# Patient Record
Sex: Male | Born: 2000 | Race: White | Hispanic: No | Marital: Single | Smoking: Never smoker
Health system: Southern US, Community
[De-identification: ages and names within clinical notes are randomized; demographics above are authoritative.]

---
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
## 2022-09-07 ENCOUNTER — Other Ambulatory Visit: Payer: Self-pay

## 2022-09-07 ENCOUNTER — Ambulatory Visit (INDEPENDENT_AMBULATORY_CARE_PROVIDER_SITE_OTHER): Payer: BC Managed Care – PPO

## 2022-09-07 ENCOUNTER — Ambulatory Visit
Admission: EM | Admit: 2022-09-07 | Discharge: 2022-09-07 | Disposition: A | Discharge status: Home / Self Care | Payer: BC Managed Care – PPO | Attending: Nurse Practitioner | Admitting: Nurse Practitioner

## 2022-09-07 DIAGNOSIS — R0602 Shortness of breath: Secondary | ICD-10-CM

## 2022-09-07 DIAGNOSIS — R079 Chest pain, unspecified: Secondary | ICD-10-CM | POA: Diagnosis not present

## 2022-09-07 DIAGNOSIS — R0789 Other chest pain: Secondary | ICD-10-CM | POA: Diagnosis not present

## 2022-09-07 NOTE — ED Provider Notes (Signed)
[facility]    CSN: [id number] Arrival date & time: 09/07/22  1407      History   Chief Complaint Chief Complaint  Patient presents with   Fatigue    I was unsure what to check. My son is having difficulty breathing and his blood pressure is low, but his pulse is high without any physical activity. - Entered by patient    HPI Thomas Downs is a 21 y.o. male.   The history is provided by the patient and a parent.   Patient brought in by his mother for sudden onset of shortness of breath, chest pain, numbness and tingling in hands and feet, chest heaviness.  Patient's mother states symptoms started this morning.  She states patient has not had any fever, chills, wheezing, abdominal pain, vomiting, or diarrhea.  Patient states that he has been nauseated.  Patient's mother states that patient ate a normal dinner last evening.  She states that symptoms started when he was playing his video game earlier today.  Patient and mother deny previous history of anxiety, depression, or panic attacks.  History reviewed. No pertinent past medical history.  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 No pertinent surgical history.     Home Medications    Prior to Admission medications   Medication Sig Start Date End Date Taking? Authorizing Provider  amphetamine-dextroamphetamine (ADDERALL XR) 10 MG 24 hr capsule Take 10 mg by mouth daily.    [provider]  cyproheptadine (PERIACTIN) 4 MG tablet Take one tablet twice daily at breakfast and dinner. 09/15/14 09/16/15  David Stall, MD  lisdexamfetamine (VYVANSE) 20 MG capsule Take 20 mg by mouth daily.    [provider]    Family History History reviewed. No pertinent family history.  Social History Social History    Tobacco Use   Smoking status: Never  Substance Use Topics   Alcohol use: Never   Drug use: Never     Allergies   Patient has no known allergies.   Review of Systems Review of Systems Per HPI  Physical Exam Triage Vital Signs ED Triage Vitals  Enc Vitals Group     BP 09/07/22 1422 120/76     Pulse Rate 09/07/22 1422 97     Resp 09/07/22 1422 18     Temp 09/07/22 1422 98 F (36.7 C)     Temp Source 09/07/22 1422 Oral     SpO2 09/07/22 1422 98 %     Weight --      Height --      Head Circumference --      Peak Flow --      Pain Score 09/07/22 1426 6     Pain Loc --      Pain Edu? --      Excl. in GC? --    No data found.  Updated Vital Signs BP 120/76 (BP Location: Right Arm)   Pulse 97   Temp 98 F (36.7 C) (Oral)   Resp 18   SpO2 98%   Visual Acuity Right Eye Distance:   Left Eye Distance:   Bilateral Distance:    Right Eye Near:   Left Eye Near:    Bilateral Near:     Physical Exam Vitals and nursing note reviewed.  Constitutional:  General: He is not in acute distress.    Appearance: Normal appearance.  HENT:     Head: Normocephalic.     Right Ear: Tympanic membrane, ear canal and external ear normal.     Left Ear: Tympanic membrane, ear canal and external ear normal.     Mouth/Throat:     Mouth: Mucous membranes are moist.  Eyes:     Extraocular Movements: Extraocular movements intact.     Conjunctiva/sclera: Conjunctivae normal.     Pupils: Pupils are equal, round, and reactive to light.  Cardiovascular:     Rate and Rhythm: Normal rate and regular rhythm.     Pulses: Normal pulses.     Heart sounds: Normal heart sounds.  Pulmonary:     Effort: Pulmonary effort is normal.     Breath sounds: Normal breath sounds.  Abdominal:     General: Bowel sounds are normal.     Palpations: Abdomen is soft.  Musculoskeletal:     Cervical back: Normal range of motion.  Lymphadenopathy:     Cervical: No cervical adenopathy.  Skin:     General: Skin is warm and dry.  Neurological:     General: No focal deficit present.     Mental Status: He is alert and oriented to person, place, and time.  Psychiatric:        Mood and Affect: Mood normal.        Behavior: Behavior normal.      UC Treatments / Results  Labs (all labs ordered are listed, but only abnormal results are displayed) Labs Reviewed - No data to display  EKG: Normal sinus rhythm, no ectopy, no STEMI   Radiology DG Chest 2 View  Result Date: 09/07/2022 CLINICAL DATA:  Chest pain and shortness of breath for a day. EXAM: CHEST - 2 VIEW COMPARISON:  None Available. FINDINGS: The heart size and mediastinal contours are within normal limits. Both lungs are clear. The visualized skeletal structures are unremarkable. IMPRESSION: No active cardiopulmonary disease. Electronically Signed   By: Gerome Sam III M.D.   On: 09/07/2022 15:08    Procedures Procedures (including critical care time)  Medications Ordered in UC Medications - No data to display  Initial Impression / Assessment and Plan / UC Course  I have reviewed the triage vital signs and the nursing notes.  Pertinent labs & imaging results that were available during my care of the patient were reviewed by me and considered in my medical decision making (see chart for details).  Patient presents for sudden onset of chest pain, shortness of breath, difficulty breathing.  His vital signs are stable, he is well-appearing, and currently is in no acute distress.  EKG showed normal sinus rhythm without ectopy, chest x-ray did not show any active cardiopulmonary disease.  Based on the patient's onset of symptoms and current presentation, symptoms are more consistent with anxiety or panic attack.  Advised patient's mother to follow-up with the patient's primary care physician for reevaluation of his symptoms.  Supportive care recommendations were provided to the patient.  Patient verbalizes understanding.  All  questions were answered.  Patient is stable for discharge. Final Clinical Impressions(s) / UC Diagnoses   Final diagnoses:  Shortness of breath  Other chest pain     Discharge Instructions      The EKG is normal. The chest xray is also negative.  Increase fluids and allow for plenty of rest. Symptoms appear to be related to anxiety and panic symptoms. May take  Tylenol or Ibuprofen as needed for pain, fever or discomfort. As discussed, please follow up with a primary care physician for further evaluation.       ED Prescriptions   None    PDMP not reviewed this encounter.   Tish Men, NP 09/07/22 1552

## 2022-09-07 NOTE — ED Triage Notes (Signed)
Pt reports breathing trouble, chest pain, fast heart beat, and hands and feet numb. Feeels as if his hands are heavy. Feet are numb on and off. Just started today out of nowhere. Was shaking earlier    Chest pain on and off at an 6  When mom took blood pressure 96/67 Pulse 121 dad took hr later 89/47 Pulse 91   When he stands from bed he gets dizzy and falls back to bed for a week now

## 2022-09-07 NOTE — Discharge Instructions (Addendum)
The EKG is normal. The chest xray is also negative.  Increase fluids and allow for plenty of rest. Symptoms appear to be related to anxiety and panic symptoms. May take Tylenol or Ibuprofen as needed for pain, fever or discomfort. As discussed, please follow up with a primary care physician for further evaluation.

## 2022-09-23 ENCOUNTER — Encounter: Payer: Self-pay | Admitting: Emergency Medicine

## 2022-09-23 ENCOUNTER — Ambulatory Visit
Admission: EM | Admit: 2022-09-23 | Discharge: 2022-09-23 | Disposition: A | Discharge status: Home / Self Care | Payer: BC Managed Care – PPO | Attending: Family Medicine | Admitting: Family Medicine

## 2022-09-23 ENCOUNTER — Ambulatory Visit: Admit: 2022-09-23 | Discharge status: Absent without leave | Payer: BC Managed Care – PPO

## 2022-09-23 ENCOUNTER — Other Ambulatory Visit: Payer: Self-pay

## 2022-09-23 DIAGNOSIS — K219 Gastro-esophageal reflux disease without esophagitis: Secondary | ICD-10-CM | POA: Diagnosis not present

## 2022-09-23 DIAGNOSIS — R1013 Epigastric pain: Secondary | ICD-10-CM | POA: Diagnosis not present

## 2022-09-23 DIAGNOSIS — R142 Eructation: Secondary | ICD-10-CM | POA: Diagnosis not present

## 2022-09-23 MED ORDER — SUCRALFATE 1 G PO TABS: 1.0000 g | ORAL_TABLET | Freq: Three times a day (TID) | ORAL | 0 refills | Status: AC | PRN

## 2022-09-23 MED ORDER — ALUM & MAG HYDROXIDE-SIMETH 200-200-20 MG/5ML PO SUSP
30.0000 mL | Freq: Once | ORAL | Status: AC
  Administered 2022-09-23: 30 mL via ORAL

## 2022-09-23 MED ORDER — PANTOPRAZOLE SODIUM 40 MG PO TBEC: 40.0000 mg | DELAYED_RELEASE_TABLET | Freq: Every day | ORAL | 0 refills | Status: AC

## 2022-09-23 NOTE — ED Triage Notes (Addendum)
Pt reports was seen for same a few weeks ago. Pt reports intermittent abdominal pain, nausea for last several weeks. Pt reports increased flatus/belching and pain as well. Have tried otc pepto,probiotic, zantac denies any change in symptoms. Last BM was black and x2 days ago.

## 2022-09-24 LAB — LIPASE: Lipase: 42 U/L (ref 13–78)

## 2022-09-24 LAB — CBC WITH DIFFERENTIAL/PLATELET
Basophils Absolute: 0.1 10*3/uL (ref 0.0–0.2)
Basos: 1 %
EOS (ABSOLUTE): 0.1 10*3/uL (ref 0.0–0.4)
Eos: 1 %
Hematocrit: 45.6 % (ref 37.5–51.0)
Hemoglobin: 15.5 g/dL (ref 13.0–17.7)
Immature Grans (Abs): 0 10*3/uL (ref 0.0–0.1)
Immature Granulocytes: 0 %
Lymphocytes Absolute: 2.5 10*3/uL (ref 0.7–3.1)
Lymphs: 46 %
MCH: 26.7 pg (ref 26.6–33.0)
MCHC: 34 g/dL (ref 31.5–35.7)
MCV: 79 fL (ref 79–97)
Monocytes Absolute: 0.6 10*3/uL (ref 0.1–0.9)
Monocytes: 11 %
Neutrophils Absolute: 2.2 10*3/uL (ref 1.4–7.0)
Neutrophils: 41 %
Platelets: 291 10*3/uL (ref 150–450)
RBC: 5.81 x10E6/uL — ABNORMAL HIGH (ref 4.14–5.80)
RDW: 13.6 % (ref 11.6–15.4)
WBC: 5.4 10*3/uL (ref 3.4–10.8)

## 2022-09-24 LAB — COMPREHENSIVE METABOLIC PANEL
ALT: 11 IU/L (ref 0–44)
AST: 11 IU/L (ref 0–40)
Albumin/Globulin Ratio: 2 (ref 1.2–2.2)
Albumin: 5 g/dL (ref 4.3–5.2)
Alkaline Phosphatase: 68 IU/L (ref 51–125)
BUN/Creatinine Ratio: 10 (ref 9–20)
BUN: 9 mg/dL (ref 6–20)
Bilirubin Total: 0.5 mg/dL (ref 0.0–1.2)
CO2: 22 mmol/L (ref 20–29)
Calcium: 9.6 mg/dL (ref 8.7–10.2)
Chloride: 103 mmol/L (ref 96–106)
Creatinine, Ser: 0.89 mg/dL (ref 0.76–1.27)
Globulin, Total: 2.5 g/dL (ref 1.5–4.5)
Glucose: 83 mg/dL (ref 70–99)
Potassium: 4.5 mmol/L (ref 3.5–5.2)
Sodium: 138 mmol/L (ref 134–144)
Total Protein: 7.5 g/dL (ref 6.0–8.5)
eGFR: 127 mL/min/{1.73_m2} (ref >59)

## 2022-09-27 NOTE — ED Provider Notes (Addendum)
[facility]    CSN: [id number] Arrival date & time: 09/23/22  1749      History   Chief Complaint Chief Complaint  Patient presents with   Abdominal Pain    HPI Thomas Downs is a 21 y.o. male.   Patient presenting today with several week history of intermittent upper abdominal pain, worse in the epigastric area, nausea, increased belching and flatus.  Denies significant vomiting, fever, chills, hematemesis, diarrhea but states his last BM was dark in color.  Has been trying over-the-counter Pepto-Bismol, probiotics, Zantac, bland foods with no improvement in symptoms.  Denies unintentional weight loss, history of known GI issues.    History reviewed. No pertinent past medical history.  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 No pertinent surgical history.     Home Medications    Prior to Admission medications   Medication Sig Start Date End Date Taking? Authorizing Provider  pantoprazole (PROTONIX) 40 MG tablet Take 1 tablet (40 mg total) by mouth daily. 09/23/22  Yes Volney American, PA-C  sucralfate (CARAFATE) 1 g tablet Take 1 tablet (1 g total) by mouth 3 (three) times daily as needed. May dissolve 1 tablet into a glass of water and drink 3 times daily as needed prior to meals 09/23/22  Yes Volney American, PA-C  amphetamine-dextroamphetamine (ADDERALL XR) 10 MG 24 hr capsule Take 10 mg by mouth daily.    [provider]  cyproheptadine (PERIACTIN) 4 MG tablet Take one tablet twice daily at breakfast and dinner. 09/15/14 09/16/15  Sherrlyn Hock, MD  lisdexamfetamine (VYVANSE) 20 MG capsule Take 20 mg by mouth daily.    [provider]    Family History History reviewed. No pertinent family history.  Social History Social History    Tobacco Use   Smoking status: Never  Substance Use Topics   Alcohol use: Never   Drug use: Never     Allergies   Patient has no known allergies.   Review of Systems Review of Systems PER HPI  Physical Exam Triage Vital Signs ED Triage Vitals  Enc Vitals Group     BP 09/23/22 1901 112/74     Pulse Rate 09/23/22 1901 80     Resp 09/23/22 1901 16     Temp 09/23/22 1901 97.9 F (36.6 C)     Temp Source 09/23/22 1901 Oral     SpO2 09/23/22 1901 97 %     Weight --      Height --      Head Circumference --      Peak Flow --      Pain Score 09/23/22 1928 9     Pain Loc --      Pain Edu? --      Excl. in [place]? --    No data found.  Updated Vital Signs BP 112/74 (BP Location: Right Arm)   Pulse 80   Temp 97.9 F (36.6 C) (Oral)   Resp 16   SpO2 97%   Visual Acuity Right Eye Distance:   Left Eye Distance:   Bilateral Distance:    Right Eye Near:   Left Eye Near:    Bilateral Near:     Physical Exam Vitals and nursing note reviewed.  Constitutional:      Appearance: Normal appearance.  HENT:  Head: Atraumatic.     Nose: Nose normal.     Mouth/Throat:     Mouth: Mucous membranes are moist.  Eyes:     Extraocular Movements: Extraocular movements intact.     Conjunctiva/sclera: Conjunctivae normal.  Cardiovascular:     Rate and Rhythm: Normal rate and regular rhythm.     Heart sounds: Normal heart sounds.  Pulmonary:     Effort: Pulmonary effort is normal.     Breath sounds: Normal breath sounds. No wheezing or rales.  Abdominal:     General: Bowel sounds are normal. There is no distension.     Palpations: Abdomen is soft.     Tenderness: There is abdominal tenderness. There is no right CVA tenderness, left CVA tenderness or guarding.  Musculoskeletal:        General: Normal range of motion.     Cervical back: Normal range of motion and neck supple.  Skin:    General: Skin is warm and dry.  Neurological:     General: No focal deficit present.      Mental Status: He is oriented to person, place, and time.  Psychiatric:        Mood and Affect: Mood normal.        Thought Content: Thought content normal.        Judgment: Judgment normal.    UC Treatments / Results  Labs (all labs ordered are listed, but only abnormal results are displayed) Labs Reviewed  CBC WITH DIFFERENTIAL/PLATELET - Abnormal; Notable for the following components:      Result Value   RBC 5.81 (*)    All other components within normal limits   Narrative:    Performed at:  [address] [place], [place]  [id number] Lab Director: Rush Farmer MD, Phone:  [phone]  COMPREHENSIVE METABOLIC PANEL   Narrative:    Performed at:  [address] [place] [address], [place], [place]  [id number] Lab Director: Rush Farmer MD, Phone:  [phone]  LIPASE   Narrative:    Performed at:  [facility] [address] [place], [place]  [id number] Lab Director: Rush Farmer MD, Phone:  [phone]   EKG  Radiology No results found.  Procedures Procedures (including critical care time)  Medications Ordered in UC Medications  alum & mag hydroxide-simeth (MAALOX/MYLANTA) 200-200-20 MG/5ML suspension 30 mL (30 mLs Oral Given 09/23/22 1951)    Initial Impression / Assessment and Plan / UC Course  I have reviewed the triage vital signs and the nursing notes.  Pertinent labs & imaging results that were available during my care of the patient were reviewed by me and considered in my medical decision making (see chart for details).     Vital signs and exam reassuring and suspicious for a gastritis type issue from uncontrolled GERD.  Suspect his dark stool was related to Pepto-Bismol use.  Treat with GI cocktail in clinic, several weeks on Protonix and Carafate, bland foods, fluids.  Labs pending for further rule out.  Return for worsening symptoms. Final Clinical Impressions(s) / UC Diagnoses   Final diagnoses:   Abdominal pain, epigastric  Belching  Gastroesophageal reflux disease, unspecified whether esophagitis present   Discharge Instructions   None    ED Prescriptions     Medication Sig Dispense Auth. Provider   pantoprazole (PROTONIX) 40 MG tablet Take 1 tablet (40 mg total) by mouth daily. 30 tablet Volney American, [place]   sucralfate (CARAFATE) 1 g tablet Take  1 tablet (1 g total) by mouth 3 (three) times daily as needed. May dissolve 1 tablet into a glass of water and drink 3 times daily as needed prior to meals 90 tablet Particia Nearing, [place]      PDMP not reviewed this encounter.   Particia Nearing, PA-C 09/27/22 0753    Particia Nearing, PA-C 09/27/22 [phone]
